# Patient Record
Sex: Female | Born: 1995 | Race: Black or African American | Marital: Single | Smoking: Never smoker
Health system: Southern US, Community
[De-identification: ages and names within clinical notes are randomized; demographics above are authoritative.]

## PROBLEM LIST (undated history)

## (undated) DIAGNOSIS — O039 Complete or unspecified spontaneous abortion without complication: Secondary | ICD-10-CM

---

## 2016-03-31 ENCOUNTER — Encounter: Payer: Self-pay | Admitting: Emergency Medicine

## 2016-03-31 ENCOUNTER — Emergency Department
Admission: EM | Admit: 2016-03-31 | Discharge: 2016-03-31 | Disposition: A | Discharge status: Home / Self Care | Payer: Managed Care, Other (non HMO) | Attending: Emergency Medicine | Admitting: Emergency Medicine

## 2016-03-31 ENCOUNTER — Emergency Department: Payer: Managed Care, Other (non HMO)

## 2016-03-31 DIAGNOSIS — N200 Calculus of kidney: Secondary | ICD-10-CM

## 2016-03-31 DIAGNOSIS — R1031 Right lower quadrant pain: Secondary | ICD-10-CM | POA: Diagnosis present

## 2016-03-31 LAB — COMPREHENSIVE METABOLIC PANEL
ALK PHOS: 76 U/L (ref 38–126)
ALT: 12 U/L — AB (ref 14–54)
AST: 18 U/L (ref 15–41)
Albumin: 3.9 g/dL (ref 3.5–5.0)
Anion gap: 9 (ref 5–15)
BILIRUBIN TOTAL: 1.2 mg/dL (ref 0.3–1.2)
BUN: 8 mg/dL (ref 6–20)
CALCIUM: 8.7 mg/dL — AB (ref 8.9–10.3)
CO2: 24 mmol/L (ref 22–32)
CREATININE: 0.7 mg/dL (ref 0.44–1.00)
Chloride: 106 mmol/L (ref 101–111)
GFR calc Af Amer: >60 mL/min (ref >60)
Glucose, Bld: 132 mg/dL — ABNORMAL HIGH (ref 65–99)
Potassium: 3.1 mmol/L — ABNORMAL LOW (ref 3.5–5.1)
Sodium: 139 mmol/L (ref 135–145)
TOTAL PROTEIN: 7.4 g/dL (ref 6.5–8.1)

## 2016-03-31 LAB — CBC
HCT: 35.4 % (ref 35.0–47.0)
Hemoglobin: 12.2 g/dL (ref 12.0–16.0)
MCH: 31.8 pg (ref 26.0–34.0)
MCHC: 34.4 g/dL (ref 32.0–36.0)
MCV: 92.4 fL (ref 80.0–100.0)
PLATELETS: 178 10*3/uL (ref 150–440)
RBC: 3.83 MIL/uL (ref 3.80–5.20)
RDW: 13.5 % (ref 11.5–14.5)
WBC: 8.9 10*3/uL (ref 3.6–11.0)

## 2016-03-31 LAB — URINALYSIS COMPLETE WITH MICROSCOPIC (ARMC ONLY)
BILIRUBIN URINE: NEGATIVE
Bacteria, UA: NONE SEEN
GLUCOSE, UA: NEGATIVE mg/dL
HGB URINE DIPSTICK: NEGATIVE
Ketones, ur: NEGATIVE mg/dL
NITRITE: NEGATIVE
Protein, ur: 30 mg/dL — AB
SPECIFIC GRAVITY, URINE: 1.024 (ref 1.005–1.030)
pH: 5 (ref 5.0–8.0)

## 2016-03-31 LAB — LIPASE, BLOOD: Lipase: 17 U/L (ref 11–51)

## 2016-03-31 LAB — POCT PREGNANCY, URINE: Preg Test, Ur: NEGATIVE

## 2016-03-31 MED ORDER — SODIUM CHLORIDE 0.9 % IV BOLUS (SEPSIS)
1000.0000 mL | Freq: Once | INTRAVENOUS | Status: AC
  Administered 2016-03-31: 1000 mL via INTRAVENOUS

## 2016-03-31 MED ORDER — KETOROLAC TROMETHAMINE 30 MG/ML IJ SOLN
30.0000 mg | Freq: Once | INTRAMUSCULAR | Status: AC
  Administered 2016-03-31: 30 mg via INTRAVENOUS

## 2016-03-31 MED ORDER — ONDANSETRON HCL 4 MG/2ML IJ SOLN
4.0000 mg | Freq: Once | INTRAMUSCULAR | Status: AC
  Administered 2016-03-31: 4 mg via INTRAVENOUS
  Filled 2016-03-31: qty 2

## 2016-03-31 MED ORDER — KETOROLAC TROMETHAMINE 30 MG/ML IJ SOLN
INTRAMUSCULAR | Status: AC
  Filled 2016-03-31: qty 1

## 2016-03-31 MED ORDER — ONDANSETRON HCL 4 MG PO TABS: 4.0000 mg | ORAL_TABLET | Freq: Every day | ORAL | 1 refills | Status: AC | PRN

## 2016-03-31 MED ORDER — MORPHINE SULFATE (PF) 4 MG/ML IV SOLN
4.0000 mg | Freq: Once | INTRAVENOUS | Status: AC
  Administered 2016-03-31: 4 mg via INTRAVENOUS
  Filled 2016-03-31: qty 1

## 2016-03-31 NOTE — ED Provider Notes (Signed)
[facility] Emergency Department Provider Note  ____________________________________________  Time seen: Approximately 8:25 AM  I have reviewed the triage vital signs and the nursing notes.   HISTORY  Chief Complaint Abdominal Pain   HPI Vanessa Franklin is a 20 y.o. female no significant past medical history who presents for evaluation of right lower quadrant abdominal pain and right flank pain. Patient reports that the pain came on suddenly this morning. She reports that the pain is severe, cramping, worse in the right flank, radiating to her right groin, associated with multiple episodes of nonbloody nonbilious emesis. She denies dysuria or hematuria, fever, prior history of kidney stones. She does report she was diagnosed with chlamydia 4 days ago and he was given IM ceftriaxone and discharged home on by mouth antibiotics. She reports that she didn't take the by mouth antibiotics and to yesterday. She didn't report no pain until this morning. She denies chest pain or shortness of breath. She denies diarrhea or constipation.  History reviewed. No pertinent past medical history.  There are no active problems to display for this patient.   No past surgical history on file.    Allergies Review of patient's allergies indicates no known allergies.  No family history on file.  Social History Social History  Substance Use Topics  . Smoking status: Not on file  . Smokeless tobacco: Not on file  . Alcohol use Not on file    Review of Systems  Constitutional: Negative for fever. Eyes: Negative for visual changes. ENT: Negative for sore throat. Cardiovascular: Negative for chest pain. Respiratory: Negative for shortness of breath. Gastrointestinal: + RLQ abdominal pain, and vomiting. No diarrhea. Genitourinary: Negative for dysuria. + R flank pain Musculoskeletal: Negative for back pain. Skin: Negative for rash. Neurological: Negative for  headaches, weakness or numbness.  ____________________________________________   PHYSICAL EXAM:  VITAL SIGNS: ED Triage Vitals [03/31/16 0802]  Enc Vitals Group     BP 121/62     Pulse Rate 88     Resp (!) 22     Temp 97.9 F (36.6 C)     Temp Source Oral     SpO2 100 %     Weight 120 lb (54.4 kg)     Height  (1.626 m)     Head Circumference      Peak Flow      Pain Score 10     Pain Loc      Pain Edu?      Excl. in GC?     Constitutional: Alert and oriented, in significant distress due to pain.  HEENT:      Head: Normocephalic and atraumatic.         Eyes: Conjunctivae are normal. Sclera is non-icteric. EOMI. PERRL      Mouth/Throat: Mucous membranes are moist.       Neck: Supple with no signs of meningismus. Cardiovascular: Regular rate and rhythm. No murmurs, gallops, or rubs. 2+ symmetrical distal pulses are present in all extremities. No JVD. Respiratory: Normal respiratory effort. Lungs are clear to auscultation bilaterally. No wheezes, crackles, or rhonchi.  Gastrointestinal: Soft, ttp over the RLQ, and non distended with positive bowel sounds. No rebound or guarding. Genitourinary: R CVA tenderness. Musculoskeletal: Nontender with normal range of motion in all extremities. No edema, cyanosis, or erythema of extremities. Neurologic: Normal speech and language. Face is symmetric. Moving all extremities. No gross focal neurologic deficits are appreciated. Skin: Skin is warm, dry and intact. No  rash noted. Psychiatric: Mood and affect are normal. Speech and behavior are normal.  ____________________________________________   LABS (all labs ordered are listed, but only abnormal results are displayed)  Labs Reviewed  COMPREHENSIVE METABOLIC PANEL - Abnormal; Notable for the following:       Result Value   Potassium 3.1 (*)    Glucose, Bld 132 (*)    Calcium 8.7 (*)    ALT 12 (*)    All other components within normal limits  URINALYSIS COMPLETEWITH  MICROSCOPIC ([facility] ONLY) - Abnormal; Notable for the following:    Color, Urine AMBER (*)    APPearance TURBID (*)    Protein, ur 30 (*)    Leukocytes, UA 1+ (*)    Squamous Epithelial / LPF 6-30 (*)    All other components within normal limits  URINE CULTURE  LIPASE, BLOOD  CBC  POC URINE PREG, ED  POCT PREGNANCY, URINE   ____________________________________________  EKG  none ____________________________________________  RADIOLOGY  CT: 88mm stone on the R with hydronephrosis  ____________________________________________   PROCEDURES  Procedure(s) performed: None Critical Care performed:  None ____________________________________________   INITIAL IMPRESSION / ASSESSMENT AND PLAN / ED COURSE  20 y.o. female no significant past medical history who presents for evaluation of sudden onset ight lower quadrant abdominal pain and right flank pain since this am. Patient in moderate distress due to pain, vital signs are within normal limits, as she has right flank tenderness and right lower quadrant tenderness with no rebound or guarding. Differential diagnoses including kidney stone, ovarian torsion, appendicitis, PID. We'll give her IV fluids, IV morphine, IV Zofran, check labs including urinalysis, CBC, CMP, U preg.   Clinical Course  Comment By Time  CT showing a 2 mm stone associated with right-sided hydronephrosis in the pelvis. Patient is no longer having pain. We'll by mouth challenge. UA is pending to rule out superimposed infection. Nita Sickle, MD 07/25 1038  Patient remains pain-free, tolerating by mouth. UA showing leukocytes but no bacteria which is most likely caused by inflammation of the ureter. We'll hold off treatment at this time as patient has no nitrites and no bacteria. Urine culture is pending. We'll discharge home with ibuprofen and Zofran  as stone most likely passed based on scan. Nita Sickle, MD 07/25 1131    Pertinent labs & imaging results  that were available during my care of the patient were reviewed by me and considered in my medical decision making (see chart for details).    ____________________________________________   FINAL CLINICAL IMPRESSION(S) / ED DIAGNOSES  Final diagnoses:  Kidney stone      NEW MEDICATIONS STARTED DURING THIS VISIT:  New Prescriptions   ONDANSETRON (ZOFRAN) 4 MG TABLET    Take 1 tablet (4 mg total) by mouth daily as needed for nausea or vomiting.     Note:  This document was prepared using Dragon voice recognition software and may include unintentional dictation errors.    Nita Sickle, MD 03/31/16 1134

## 2016-03-31 NOTE — Discharge Instructions (Signed)
You have been seen in the Emergency Department (ED)  Today and was diagnosed with kidney stones. While the stone is traveling through the ureter, which is the tube that carries urine from the kidney to the bladder, you will probably feel pain. The pain may be mild or very severe. You may also have some blood in your urine. As soon as the stone reaches the bladder, any intense pain should go away. If a stone is too large to pass on its own, you may need a medical procedure to help you pass the stone.  ° °As we have discussed, please drink plenty of fluids and use a urinary strainer to attempt to capture the stone.  Please make a follow up appointment with Urology in the next week by calling the number below and bring the stone with you. ° °Take ibuprofen 600mg every 6 hours for the pain. Check with your doctor if you have a history of gastritis, stomach ulcers, renal failure or impaired kidney function as you may not be able to take ibuprofen/ motrin. Your doctor can give you a different prescription for pain control. ° °Follow-up with your doctor or return to the ER in 12-24 hours if your pain is not well controlled, if you develop pain or burning with urination, or if you develop a fever. Otherwise follow up in 3-5 days with your doctor. ° °When should you call for help?  °Call your doctor now or seek immediate medical care if:  °You cannot keep down fluids.  °Your pain gets worse.  °You have a fever or chills.  °You have new or worse pain in your back just below your rib cage (the flank area).  °You have new or more blood in your urine. °You have pain or burning with urination °You are unable to urinate °You have abdominal pain ° °Watch closely for changes in your health, and be sure to contact your doctor if:  °You do not get better as expected ° °How can you care for yourself at home?  °Drink plenty of fluids, enough so that your urine is light yellow or clear like water. If you have kidney, heart, or liver  disease and have to limit fluids, talk with your doctor before you increase the amount of fluids you drink.  °Take pain medicines exactly as directed. Call your doctor if you think you are having a problem with your medicine.  °If the doctor gave you a prescription medicine for pain, take it as prescribed.  °If you are not taking a prescription pain medicine, ask your doctor if you can take an over-the-counter medicine. Read and follow all instructions on the label. °Your doctor may ask you to strain your urine so that you can collect your kidney stone when it passes. You can use a kitchen strainer or a tea strainer to catch the stone. Store it in a plastic bag until you see your doctor again. ° °Preventing future kidney stones  °Some changes in your diet may help prevent kidney stones. Depending on the cause of your stones, your doctor may recommend that you:  °Drink plenty of fluids, enough so that your urine is light yellow or clear like water. If you have kidney, heart, or liver disease and have to limit fluids, talk with your doctor before you increase the amount of fluids you drink.  °Limit coffee, tea, and alcohol. Also avoid grapefruit juice.  °Do not take more than the recommended daily dose of vitamins C and D.  °  Avoid antacids such as Gaviscon, Maalox, Mylanta, or Tums.  Limit the amount of salt (sodium) in your diet.  Eat a balanced diet that is not too high in protein.  Limit foods that are high in a substance called oxalate, which can cause kidney stones. These foods include dark green vegetables, rhubarb, chocolate, wheat bran, nuts, cranberries, and beans.

## 2016-03-31 NOTE — ED Notes (Signed)
Pt verbalized desire to be discharged. MD made aware.

## 2016-03-31 NOTE — ED Triage Notes (Signed)
Pt reports lower abdominal pain and right flank pain since this morning. Pt has been vomiting this AM.

## 2016-03-31 NOTE — ED Notes (Signed)
Pt given po trial per MD.  [place] and Sprite given

## 2016-03-31 NOTE — ED Notes (Signed)
POC preg NEG 

## 2016-03-31 NOTE — ED Notes (Signed)
Pt complains of new onset abdominal pain 10/10 lower back and pelvis.  Pt actively vomiting at this time.

## 2016-04-01 LAB — URINE CULTURE

## 2016-04-27 DIAGNOSIS — O039 Complete or unspecified spontaneous abortion without complication: Secondary | ICD-10-CM

## 2016-04-27 HISTORY — DX: Complete or unspecified spontaneous abortion without complication: O03.9

## 2016-05-09 ENCOUNTER — Emergency Department
Admission: EM | Admit: 2016-05-09 | Discharge: 2016-05-09 | Disposition: A | Discharge status: Home / Self Care | Payer: Managed Care, Other (non HMO) | Attending: Emergency Medicine | Admitting: Emergency Medicine

## 2016-05-09 ENCOUNTER — Emergency Department: Payer: Managed Care, Other (non HMO)

## 2016-05-09 ENCOUNTER — Encounter: Payer: Self-pay | Admitting: Emergency Medicine

## 2016-05-09 DIAGNOSIS — R51 Headache: Secondary | ICD-10-CM | POA: Diagnosis not present

## 2016-05-09 DIAGNOSIS — G8929 Other chronic pain: Secondary | ICD-10-CM | POA: Insufficient documentation

## 2016-05-09 LAB — CBC WITH DIFFERENTIAL/PLATELET
Basophils Absolute: 0 10*3/uL (ref 0–0.1)
Basophils Relative: 1 %
EOS PCT: 1 %
Eosinophils Absolute: 0 10*3/uL (ref 0–0.7)
HCT: 33.6 % — ABNORMAL LOW (ref 35.0–47.0)
Hemoglobin: 11.5 g/dL — ABNORMAL LOW (ref 12.0–16.0)
LYMPHS ABS: 1.9 10*3/uL (ref 1.0–3.6)
LYMPHS PCT: 35 %
MCH: 32.1 pg (ref 26.0–34.0)
MCHC: 34.4 g/dL (ref 32.0–36.0)
MCV: 93.4 fL (ref 80.0–100.0)
MONO ABS: 0.4 10*3/uL (ref 0.2–0.9)
Monocytes Relative: 7 %
Neutro Abs: 3.1 10*3/uL (ref 1.4–6.5)
Neutrophils Relative %: 56 %
PLATELETS: 179 10*3/uL (ref 150–440)
RBC: 3.6 MIL/uL — ABNORMAL LOW (ref 3.80–5.20)
RDW: 13.4 % (ref 11.5–14.5)
WBC: 5.5 10*3/uL (ref 3.6–11.0)

## 2016-05-09 LAB — BASIC METABOLIC PANEL
Anion gap: 3 — ABNORMAL LOW (ref 5–15)
BUN: 5 mg/dL — AB (ref 6–20)
CHLORIDE: 107 mmol/L (ref 101–111)
CO2: 28 mmol/L (ref 22–32)
Calcium: 9.2 mg/dL (ref 8.9–10.3)
Creatinine, Ser: 0.55 mg/dL (ref 0.44–1.00)
GFR calc Af Amer: >60 mL/min (ref >60)
GFR calc non Af Amer: >60 mL/min (ref >60)
GLUCOSE: 86 mg/dL (ref 65–99)
POTASSIUM: 3.2 mmol/L — AB (ref 3.5–5.1)
Sodium: 138 mmol/L (ref 135–145)

## 2016-05-09 MED ORDER — KETOROLAC TROMETHAMINE 30 MG/ML IJ SOLN
30.0000 mg | Freq: Once | INTRAMUSCULAR | Status: AC
  Administered 2016-05-09: 30 mg via INTRAVENOUS
  Filled 2016-05-09: qty 1

## 2016-05-09 MED ORDER — KETOROLAC TROMETHAMINE 10 MG PO TABS: 10.0000 mg | ORAL_TABLET | Freq: Three times a day (TID) | ORAL | 0 refills | Status: DC

## 2016-05-09 MED ORDER — SODIUM CHLORIDE 0.9 % IV BOLUS (SEPSIS)
500.0000 mL | Freq: Once | INTRAVENOUS | Status: AC
  Administered 2016-05-09: 500 mL via INTRAVENOUS

## 2016-05-09 MED ORDER — METOCLOPRAMIDE HCL 5 MG PO TABS: 5.0000 mg | ORAL_TABLET | Freq: Three times a day (TID) | ORAL | 0 refills | Status: DC | PRN

## 2016-05-09 MED ORDER — METOCLOPRAMIDE HCL 5 MG/ML IJ SOLN
10.0000 mg | Freq: Once | INTRAMUSCULAR | Status: AC
  Administered 2016-05-09: 10 mg via INTRAVENOUS
  Filled 2016-05-09: qty 2

## 2016-05-09 NOTE — Discharge Instructions (Signed)
Take the prescription pain medicine as needed for headaches. Take the nausea medicine as needed. Follow-up with [facility] for interim headache pain relief.

## 2016-05-09 NOTE — ED Provider Notes (Signed)
[facility] Emergency Department Provider Note ____________________________________________  Time seen: 1739  I have reviewed the triage vital signs and the nursing notes.  HISTORY  Chief Complaint  Headache  HPI Vanessa Franklin is a 20 y.o. female presents to the ED for evaluation of her left frontal and sexual headache that has been worsening over the last week and a half. The patient describes a chronic, [place] the headache for the last 10 days. Prior to that she reported headache frequency on the average of 2-3 headaches per week since March. She is been previously evaluated by [facility] back in March and was prescribed a prescription medication which she has since completed. She reported only limited benefit with that prescription medication. 2 weeks following that visit she was evaluated at [facility] urgent [facility] after sudden onset of a sharp left-sided headache while she was driving. At the time she noted sudden lites and sensitivity, nausea and vomiting. The urgent care treated her with a IM injection and a by mouth. No further testing or imaging was ordered at that time. Her headache did not resolve and they suggested that she follow-up with the ED emergently. The patient describes she did not follow with the ED as directed. Since that time she has noted increasedfrequency of her headache now noting a headache that has been unrelenting for the last week and a half. She has taken Advil, Aleve, ibuprofen, and Tylenol with limited benefit. She reports ongoing nausea without vomiting. She describes the headache as throbbing in nature and notes associated photophobia, She denies any prodromal or is associated with a headache. She is aware when the headaches began noting that the headaches ramp up slowly but are severe in nature. Her current headache she rates it a 9/10 in triage. She reports that her primary provider at [facility], has her scheduled to see a neurologist, but  that appointment is not until February at this time. She has never been on any headache prophylaxis and has not been evaluated with imaging.  History reviewed. No pertinent past medical history.  There are no active problems to display for this patient.  History reviewed. No pertinent surgical history.  Prior to Admission medications   Medication Sig Start Date End Date Taking? Authorizing Provider  ketorolac (TORADOL) 10 MG tablet Take 1 tablet (10 mg total) by mouth every 8 (eight) hours. 05/09/16   Chemika Nightengale V Bacon [place] Chelf, PA-C  metoCLOPramide (REGLAN) 5 MG tablet Take 1 tablet (5 mg total) by mouth every 8 (eight) hours as needed for nausea or vomiting. 05/09/16   Shaelyn Decarli V Bacon Seleena Reimers, PA-C   Allergies Review of patient's allergies indicates no known allergies.  No family history on file.  Social History Social History  Substance Use Topics  . Smoking status: Never Smoker  . Smokeless tobacco: Not on file  . Alcohol use No   Review of Systems  Constitutional: Negative for fever. Eyes: Negative for visual changes.Reports light sensitivity. ENT: Negative for sore throat. Cardiovascular: Negative for chest pain. Respiratory: Negative for shortness of breath. Gastrointestinal: Negative for abdominal pain, vomiting and diarrhea. Musculoskeletal: Negative for back pain. Neurological: Negative for focal weakness or numbness. Reports headache as above. ____________________________________________  PHYSICAL EXAM:  VITAL SIGNS: ED Triage Vitals  Enc Vitals Group     BP 05/09/16 1613 123/63     Pulse Rate 05/09/16 1613 92     Resp 05/09/16 1613 20     Temp 05/09/16 1613 98.4 F (36.9 C)  Temp Source 05/09/16 1613 Oral     SpO2 05/09/16 1613 100 %     Weight 05/09/16 1614 120 lb (54.4 kg)     Height 05/09/16 1614 5\' 4"  (1.626 m)     Head Circumference --      Peak Flow --      Pain Score 05/09/16 1614 10     Pain Loc --      Pain Edu? --      Excl. in GC? --      Constitutional: Alert and oriented. Well appearing and in no distress. Head: Normocephalic and atraumatic.      Eyes: Conjunctivae are normal. PERRL. Normal extraocular movements      Ears: Canals clear. TMs intact bilaterally.   Nose: No congestion/rhinorrhea.   Mouth/Throat: Mucous membranes are moist.   Neck: Supple. No thyromegaly. Cardiovascular: Normal rate, regular rhythm.  Respiratory: Normal respiratory effort. No wheezes/rales/rhonchi. Gastrointestinal: Soft and nontender. No distention. Musculoskeletal: Nontender with normal range of motion in all extremities.  Neurologic: Cranial nerves II through XII grossly intact. Normal speech and language. No gross focal neurologic deficits are appreciated. Skin:  Skin is warm, dry and intact. No rash noted. Psychiatric: Mood and affect are normal. Patient exhibits appropriate insight and judgment. ____________________________________________    LABS (pertinent positives/negatives) Labs Reviewed  BASIC METABOLIC PANEL - Abnormal; Notable for the following:       Result Value   Potassium 3.2 (*)    BUN 5 (*)    Anion gap 3 (*)    All other components within normal limits  CBC WITH DIFFERENTIAL/PLATELET - Abnormal; Notable for the following:    RBC 3.60 (*)    Hemoglobin 11.5 (*)    HCT 33.6 (*)    All other components within normal limits  URINALYSIS COMPLETEWITH MICROSCOPIC ([facility] ONLY)  ____________________________________________   RADIOLOGY  Non-contrast Head CT  IMPRESSION: Negative noncontrast head CT. ____________________________________________  PROCEDURES  Toradol 30 mg IVP Reglan 10 mg IVP NS 500 ml bolus ____________________________________________  INITIAL IMPRESSION / ASSESSMENT AND PLAN / ED COURSE  Patient with reassuring labs and a normal exam without indication of acute intracranial process. A CT scan was also negative for any acute organic abnormality. Patient reports near resolution of  her headache, citing her headache pain reduced to 2/10 at time of this disposition. She will follow up with neurology as previously scheduled via [facility]. She will be discharged with a prescription for ketorolac and Reglan. She is advised to dose OTC Benadryl in addition for headache abortive. See [facility] for interim headache management.   Clinical Course   ____________________________________________  FINAL CLINICAL IMPRESSION(S) / ED DIAGNOSES  Final diagnoses:  Chronic nonintractable headache, unspecified headache type      Lissa Hoard, PA-C 05/09/16 2114    Emily Filbert, MD 05/09/16 2255

## 2016-05-09 NOTE — ED Triage Notes (Signed)
States migraine x 1 week, taking aleve and advil without relief

## 2016-05-11 ENCOUNTER — Ambulatory Visit
Admission: EM | Admit: 2016-05-11 | Discharge: 2016-05-11 | Discharge status: Absent without leave | Payer: Managed Care, Other (non HMO)

## 2016-11-05 ENCOUNTER — Ambulatory Visit
Admission: EM | Admit: 2016-11-05 | Discharge: 2016-11-05 | Disposition: A | Discharge status: Home / Self Care | Payer: Managed Care, Other (non HMO)

## 2016-11-05 DIAGNOSIS — N898 Other specified noninflammatory disorders of vagina: Secondary | ICD-10-CM

## 2016-11-05 DIAGNOSIS — Z8759 Personal history of other complications of pregnancy, childbirth and the puerperium: Secondary | ICD-10-CM

## 2016-11-05 DIAGNOSIS — R102 Pelvic and perineal pain: Secondary | ICD-10-CM | POA: Diagnosis not present

## 2016-11-05 HISTORY — DX: Complete or unspecified spontaneous abortion without complication: O03.9

## 2016-11-05 NOTE — ED Provider Notes (Signed)
CSN: [id number]     Arrival date & time 11/05/16  1447 History   First MD Initiated Contact with Patient 11/05/16 1533     Chief Complaint  Patient presents with  . Abdominal Pain    The history is provided by the patient.  Abdominal Pain  Pain location: suprapubic. Pain quality: cramping   Pain radiates to:  Does not radiate Onset quality:  Sudden Duration:  3 weeks Timing:  Intermittent Progression since onset: cramping started after abortion but intensity is less now than it was initially. Context: not recent sexual activity   Relieved by: some pain improvement with Aleve. Associated symptoms: vaginal bleeding (Vaginal bleeding stopped last week) and vaginal discharge (brown vaginal discharge started last week after bleeding stopped, bad odor per patient)   Associated symptoms: no constipation, no diarrhea, no dysuria, no fever, no hematuria and no nausea    Pt with medical abortion on 10/12/16 at [place] clinic in [place]. She reports she took one pill in the clinic then another four pills the next day. She does not remember what the medication was. She has not been able to get back to the clinic for follow up. Pt denies sexual activity since the abortion.  Pt had a medical abortion 04/27/16 and then presented to UC, PCP, and then [facility] with symptoms similar to today's presentation except then she c/o vaginal bleeding instead of discharge. An US at that time found retrained product of pregnancy that required and D&C.  No past medical history on file. No past surgical history on file. No family history on file. Social History  Substance Use Topics  . Smoking status: Never Smoker  . Smokeless tobacco: Never Used  . Alcohol use No   OB History    No data available     Review of Systems  Constitutional: Negative for fever.  Gastrointestinal: Positive for abdominal pain. Negative for constipation, diarrhea and nausea.  Genitourinary: Positive for vaginal bleeding (Vaginal bleeding  stopped last week) and vaginal discharge (brown vaginal discharge started last week after bleeding stopped, bad odor per patient). Negative for dysuria and hematuria.    Allergies  Patient has no known allergies.  Home Medications   Prior to Admission medications   Not on File   Meds Ordered and Administered this Visit  Medications - No data to display  BP 114/74 (BP Location: Left Arm)   Pulse (!) 102   Temp 98.7 F (37.1 C) (Oral)   Resp 18   Ht 5\' 5"  (1.651 m)   Wt 125 lb (56.7 kg)   LMP 10/13/2016   SpO2 100%   BMI 20.80 kg/m  No data found.   Physical Exam  Constitutional: She is oriented to person, place, and time. She appears well-developed and well-nourished.  HENT:  Head: Normocephalic and atraumatic.  Eyes: EOM are normal. Pupils are equal, round, and reactive to light.  Neck: Normal range of motion. Neck supple.  Cardiovascular: Normal rate, regular rhythm and normal heart sounds.   Pulmonary/Chest: Effort normal and breath sounds normal.  Abdominal: Soft. She exhibits no distension. There is no tenderness. There is no rebound and no guarding.  Neurological: She is alert and oriented to person, place, and time.  Skin: Skin is warm and dry.    Urgent Care Course     Procedures (including critical care time)  Labs Review Labs Reviewed - No data to display  Imaging Review No results found.    MDM   1. Pelvic cramping  2. Vaginal discharge   3. Abortion history    Pt status post medical abortion on 2/5 and continues to have brown discharge and cramping off and on. Pt's presentation is very similar to her presentation on 05/12/16 with vaginal bleeding after a medical abortion on 8/21. At that time, she was found to have retained product and required a D&C. Given the similarity in presentation, a complete evaluation will require an US examination and possible intervention. [place] is not available at this facility at this time.   Discussion with patient  with above concerns for similar presentation and possibility of retained product and infection and the need for US exam for complete evaluation. Pt was given the option of [facility] in [place] and [facility] in [place] as [facility] would not have a GYN physician available for [facility] if in needed. Pt understood and was in agreement with plan.   [facility] ER Triage Nurse Vikki Ports was contacted and given report information on the patient.    Candis Schatz, PA-C 11/05/16 [phone]

## 2016-11-05 NOTE — ED Triage Notes (Addendum)
Pt had an abortion Feb 5, she is having some lower abdominal cramping and discharge with odor. She states the bleeding has stopped though. Approx gestation was 7-8 weeks

## 2016-11-05 NOTE — Discharge Instructions (Signed)
-   Present to emergency room at [facility] in [place] for further evaluation and management at facility with ultrasound and OB/GYN services available. If unable to go to [facility], [facility] in [place] can also provide the care you need.

## 2018-01-14 IMAGING — CT CT HEAD W/O CM
3 series · 15 of 45 positions shown, 18 images · non-contrast
Comparison: None.

CLINICAL DATA: Worsening intractable headache for 2 weeks.

EXAM:
CT HEAD WITHOUT CONTRAST
TECHNIQUE: Contiguous axial images were obtained from the base of the skull
through the vertex without intravenous contrast.

[Series 2: head wo · axial · 0.47mm/px · z∈[+563,+678]mm · 9 of 28 slices shown, 12 images]
[im 3/28  brain]
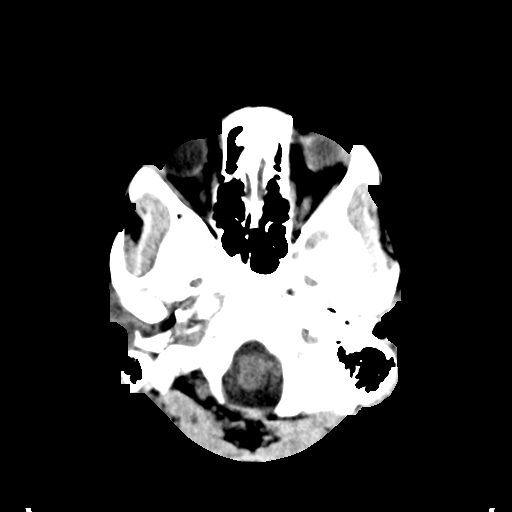
[im 3/28  bone]
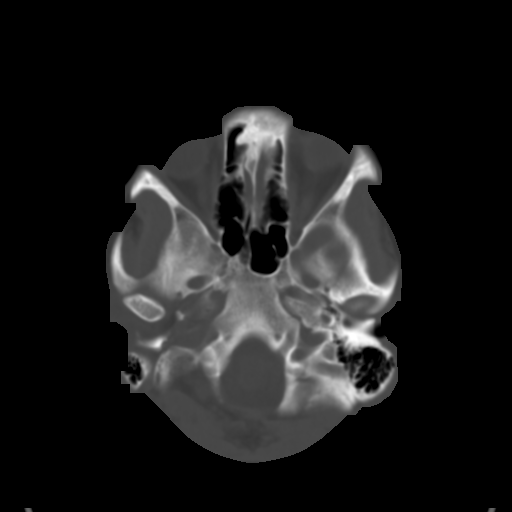
[im 6/28  brain]
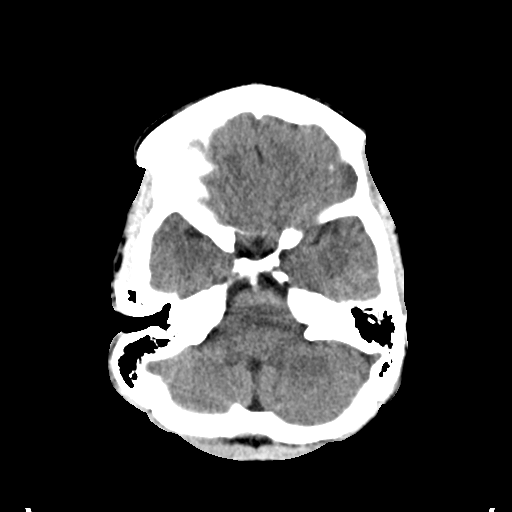
[im 9/28  brain]
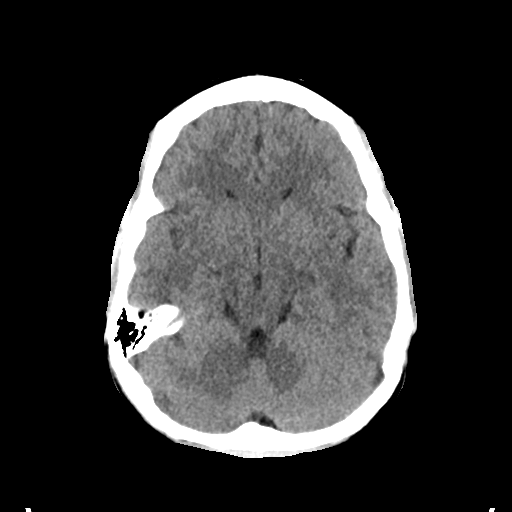
[im 12/28  brain]
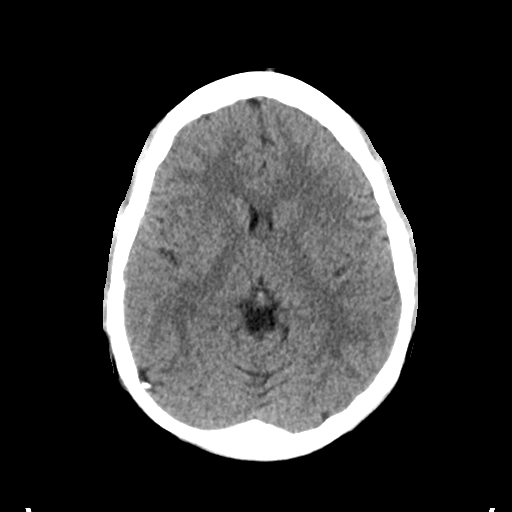
[im 15/28  brain]
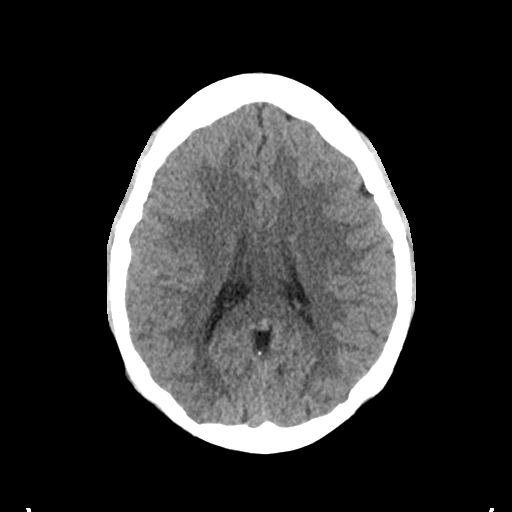
[im 15/28  bone]
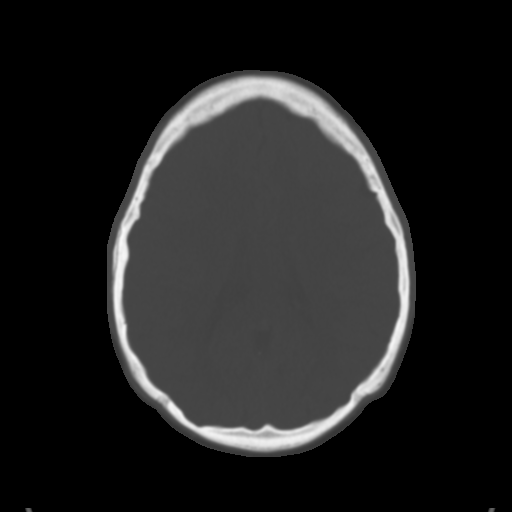
[im 17/28  brain]
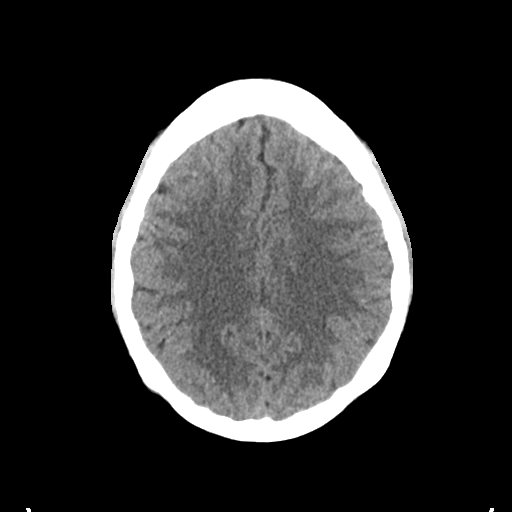
[im 20/28  brain]
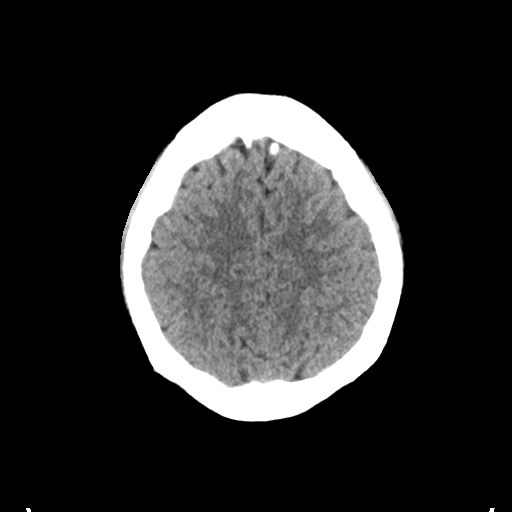
[im 23/28  brain]
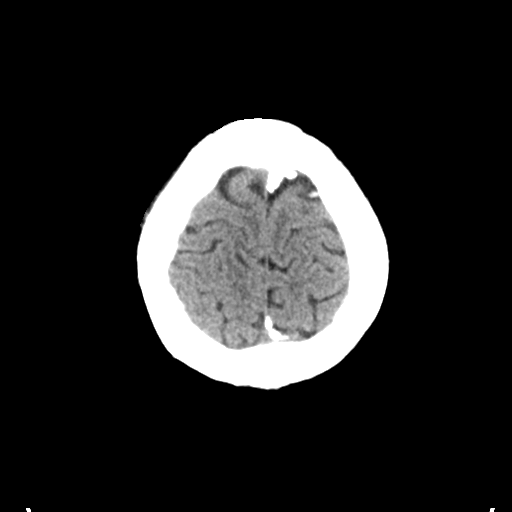
[im 26/28  brain]
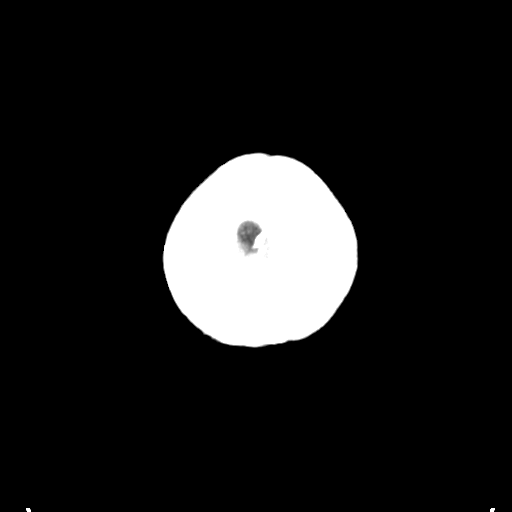
[im 26/28  bone]
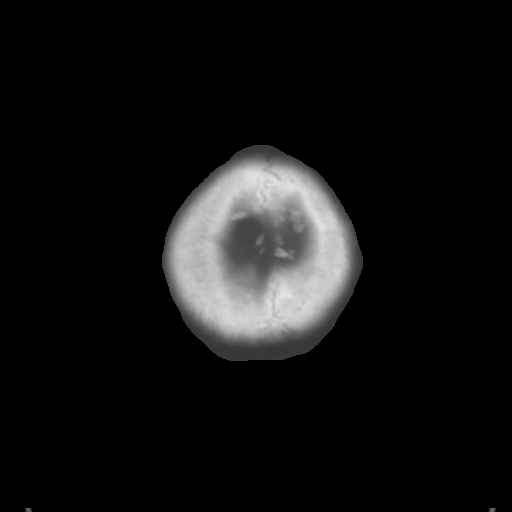

[Series 4: coronal soft tissue · coronal · 0.25mm/px · 3 of 67 slices shown]
[im 23/67  brain]
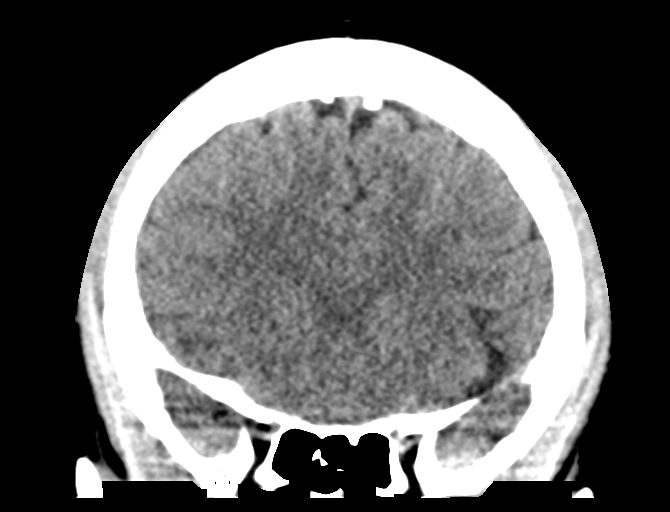
[im 30/67  brain]
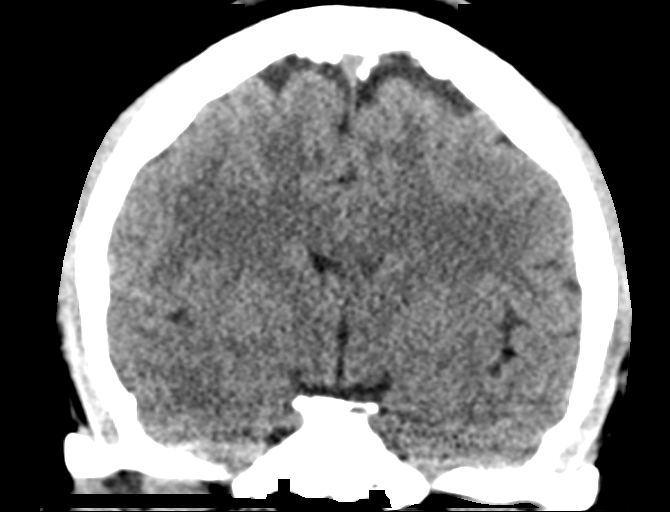
[im 37/67  brain]
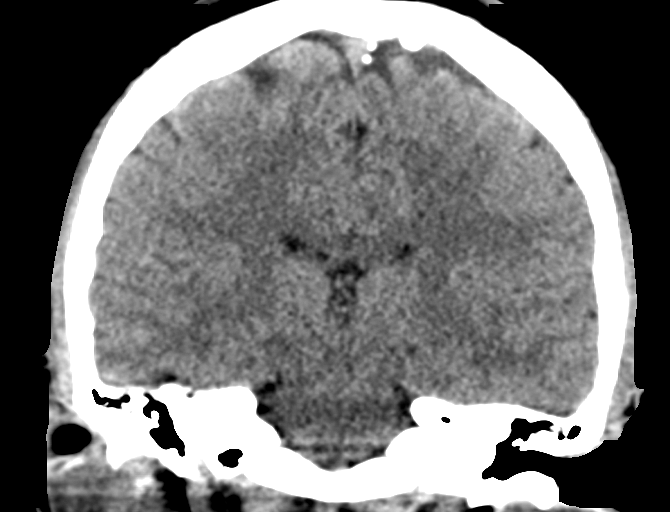

[Series 5: sagittal soft tissue · sagittal · 0.27mm/px · 3 of 52 slices shown]
[im 18/52  brain]
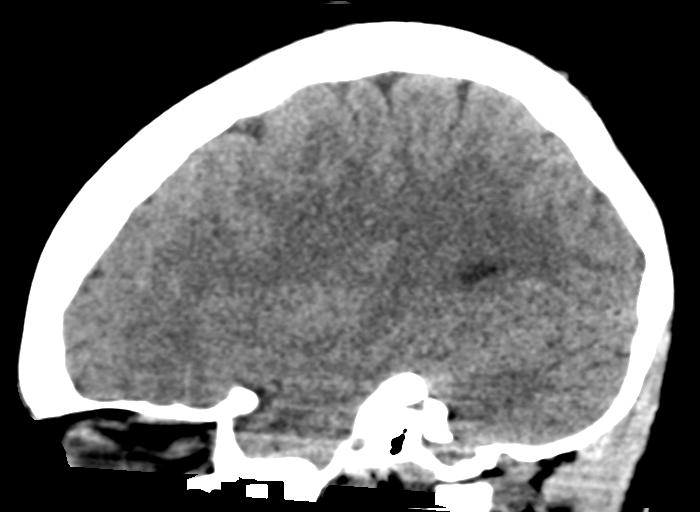
[im 26/52  brain]
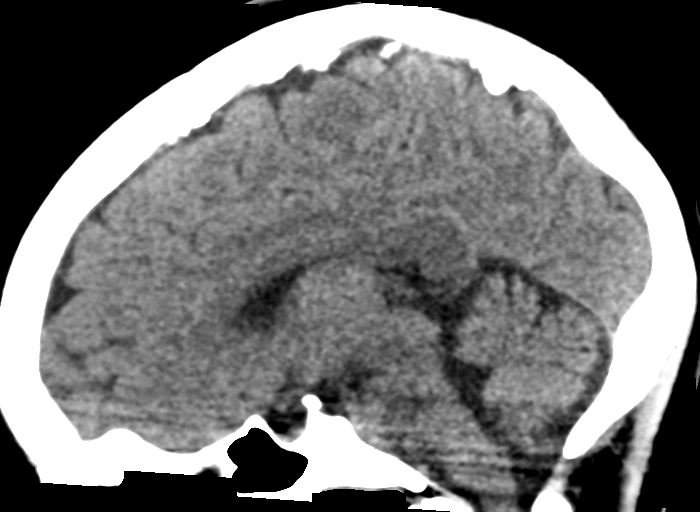
[im 35/52  brain]
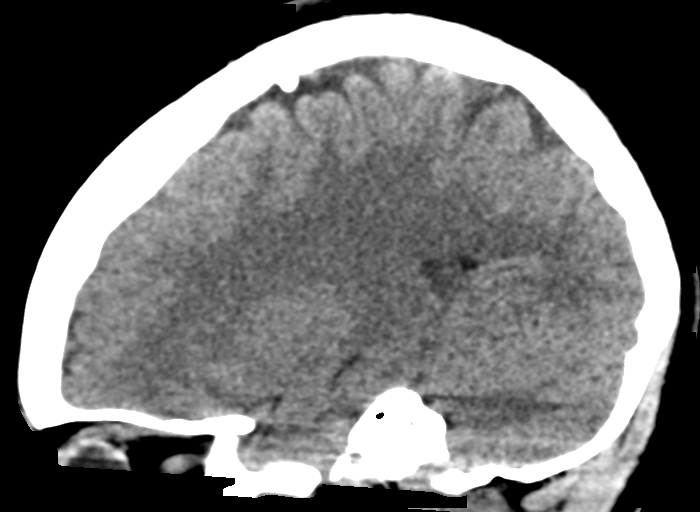

[15 of 45 positions shown; findings below may reference images not displayed]

FINDINGS: Brain: No evidence of acute infarction, hemorrhage, hydrocephalus,
extra-axial collection or mass lesion/mass effect.

Vascular: No hyperdense vessel or unexpected calcification.

Skull: No fracture or other bone lesions identified.

Sinuses/Orbits: No acute finding.

Other: None
IMPRESSION: Negative noncontrast head CT.

## 2018-04-25 ENCOUNTER — Ambulatory Visit
Admission: EM | Admit: 2018-04-25 | Discharge: 2018-04-25 | Disposition: A | Discharge status: Home / Self Care | Payer: Managed Care, Other (non HMO) | Attending: Internal Medicine | Admitting: Internal Medicine

## 2018-04-25 ENCOUNTER — Other Ambulatory Visit: Payer: Self-pay

## 2018-04-25 DIAGNOSIS — J020 Streptococcal pharyngitis: Secondary | ICD-10-CM

## 2018-04-25 LAB — RAPID STREP SCREEN (MED CTR MEBANE ONLY): Streptococcus, Group A Screen (Direct): POSITIVE — AB

## 2018-04-25 MED ORDER — PENICILLIN G BENZATHINE 1200000 UNIT/2ML IM SUSP
1.2000 10*6.[IU] | Freq: Once | INTRAMUSCULAR | Status: AC
  Administered 2018-04-25: 1.2 10*6.[IU] via INTRAMUSCULAR

## 2018-04-25 NOTE — Discharge Instructions (Addendum)
Take over-the-counter Tylenol and ibuprofen as needed.  Rest. Drink plenty of fluids.  ° °Follow up with your primary care physician this week as needed. Return to Urgent care for new or worsening concerns.  ° °

## 2018-04-25 NOTE — ED Triage Notes (Signed)
Patient complains of sore throat, body aches and chills x 2 days.

## 2018-04-25 NOTE — ED Provider Notes (Signed)
[facility] ____________________________________________  Time seen: Approximately 12:32 PM  I have reviewed the triage vital signs and the nursing notes.   HISTORY  Chief Complaint Sore Throat   HPI Vanessa Franklin is a 22 y.o. female presented for evaluation of sore throat for the last 2 days with accompanying chills, body aches and intermittent headache.  Has not been taking any over-the-counter medications for the same complaints, no antipyretics taken today prior to arrival.  States occasional nasal congestion, no cough or increased nasal congestion.  Sore throat currently moderate.  Has overall continued to drink water well but decreased eating as it hurts to swallow.  Denies known sick contacts, does work around elderly.  22-year-old at home not sick.  Denies other aggravating alleviating factors.  Reports otherwise feels well.  Denies current pregnancy.  No recent antibiotic use.   Past Medical History:  Diagnosis Date  . Abortion 04/27/2016    There are no active problems to display for this patient.   History reviewed. No pertinent surgical history.   No current facility-administered medications for this encounter.  No current outpatient medications on file.  Allergies Patient has no known allergies.  Family History  Problem Relation Age of Onset  . Hypertension Mother     Social History Social History   Tobacco Use  . Smoking status: Never Smoker  . Smokeless tobacco: Never Used  Substance Use Topics  . Alcohol use: Yes    Comment: occasionally  . Drug use: Not on file    Review of Systems Constitutional: positive chills.  ENT: positive sore throat. Cardiovascular: Denies chest pain. Respiratory: Denies shortness of breath Musculoskeletal: Negative for back pain. Skin: Negative for rash.   ____________________________________________   PHYSICAL EXAM:  VITAL SIGNS: ED Triage Vitals  Enc Vitals Group     BP 04/25/18 1022  121/75     Pulse Rate 04/25/18 1022 (!) 109     Resp 04/25/18 1022 18     Temp 04/25/18 1022 99 F (37.2 C)     Temp Source 04/25/18 1022 Oral     SpO2 04/25/18 1022 100 %     Weight 04/25/18 1022 130 lb (59 kg)     Height 04/25/18 1022 5\' 4"  (1.626 m)     Head Circumference --      Peak Flow --      Pain Score 04/25/18 1021 6     Pain Loc --      Pain Edu? --      Excl. in GC? --     Constitutional: Alert and oriented. Well appearing and in no acute distress. Eyes: Conjunctivae are normal. Head: Atraumatic. No sinus tenderness to palpation. No swelling. No erythema.  Ears: no erythema, normal TMs bilaterally.   Nose:No nasal congestion   Mouth/Throat: Mucous membranes are moist.moderate pharyngeal erythema.  Mild bilateral tonsillar swelling.  No exudate.  No uvular shift or deviation. Neck: No stridor.  No cervical spine tenderness to palpation. Hematological/Lymphatic/Immunilogical: Anterior bilateral cervical lymphadenopathy. Cardiovascular: Normal rate, regular rhythm. Grossly normal heart sounds.  Good peripheral circulation. Respiratory: Normal respiratory effort.  No retractions. No wheezes, rales or rhonchi. Good air movement.  Musculoskeletal: Ambulatory with steady gait. No cervical, thoracic or lumbar tenderness to palpation. Neurologic:  Normal speech and language. No gait instability. Skin:  Skin appears warm, dry and intact. No rash noted. Psychiatric: Mood and affect are normal. Speech and behavior are normal.  ___________________________________________   LABS (all labs ordered are listed, but  only abnormal results are displayed)  Labs Reviewed  RAPID STREP SCREEN ([facility] ONLY) - Abnormal; Notable for the following components:      Result Value   Streptococcus, Group A Screen (Direct) POSITIVE (*)    All other components within normal limits   ____________________________________________  PROCEDURES Procedures     INITIAL IMPRESSION /  ASSESSMENT AND PLAN / ED COURSE  Pertinent labs & imaging results that were available during my care of the patient were reviewed by me and considered in my medical decision making (see chart for details).  Well-appearing patient.  No acute distress.  Strep positive.  Exam consistent with same.  Discussed treatment options with patient, patient elected for Bicillin.  1,200,000 units Bicillin injection given once in urgent care.  Encourage rest, fluids, over-the-counter Tylenol and ibuprofen, gargles and lozenges.  Work note given for today and tomorrow.Discussed indication, risks and benefits of medications with patient.  Discussed follow up with Primary care physician this week. Discussed follow up and return parameters including no resolution or any worsening concerns. Patient verbalized understanding and agreed to plan.   ____________________________________________   FINAL CLINICAL IMPRESSION(S) / ED DIAGNOSES  Final diagnoses:  Strep throat     ED Discharge Orders    None       Note: This dictation was prepared with Dragon dictation along with smaller phrase technology. Any transcriptional errors that result from this process are unintentional.         Renford DillsMiller, Coralie Stanke, NP 04/25/18 1234

## 2018-07-08 ENCOUNTER — Ambulatory Visit
Admission: EM | Admit: 2018-07-08 | Discharge: 2018-07-08 | Disposition: A | Discharge status: Home / Self Care | Payer: Managed Care, Other (non HMO) | Attending: Family Medicine | Admitting: Family Medicine

## 2018-07-08 ENCOUNTER — Encounter: Payer: Self-pay | Admitting: Emergency Medicine

## 2018-07-08 ENCOUNTER — Other Ambulatory Visit: Payer: Self-pay

## 2018-07-08 DIAGNOSIS — B9689 Other specified bacterial agents as the cause of diseases classified elsewhere: Secondary | ICD-10-CM

## 2018-07-08 DIAGNOSIS — R300 Dysuria: Secondary | ICD-10-CM

## 2018-07-08 DIAGNOSIS — Z113 Encounter for screening for infections with a predominantly sexual mode of transmission: Secondary | ICD-10-CM

## 2018-07-08 DIAGNOSIS — L0291 Cutaneous abscess, unspecified: Secondary | ICD-10-CM

## 2018-07-08 DIAGNOSIS — N760 Acute vaginitis: Secondary | ICD-10-CM

## 2018-07-08 DIAGNOSIS — N9089 Other specified noninflammatory disorders of vulva and perineum: Secondary | ICD-10-CM

## 2018-07-08 LAB — URINALYSIS, COMPLETE (UACMP) WITH MICROSCOPIC
BACTERIA UA: NONE SEEN
BILIRUBIN URINE: NEGATIVE
Glucose, UA: NEGATIVE mg/dL
KETONES UR: NEGATIVE mg/dL
LEUKOCYTES UA: NEGATIVE
Nitrite: NEGATIVE
PROTEIN: NEGATIVE mg/dL
SQUAMOUS EPITHELIAL / LPF: NONE SEEN (ref 0–5)
Specific Gravity, Urine: 1.02 (ref 1.005–1.030)
pH: 7 (ref 5.0–8.0)

## 2018-07-08 LAB — WET PREP, GENITAL
SPERM: NONE SEEN
Trich, Wet Prep: NONE SEEN
Yeast Wet Prep HPF POC: NONE SEEN

## 2018-07-08 LAB — CHLAMYDIA/NGC RT PCR (ARMC ONLY)
CHLAMYDIA TR: NOT DETECTED
N GONORRHOEAE: NOT DETECTED

## 2018-07-08 MED ORDER — SULFAMETHOXAZOLE-TRIMETHOPRIM 800-160 MG PO TABS: 1.0000 | ORAL_TABLET | Freq: Two times a day (BID) | ORAL | 0 refills | Status: AC

## 2018-07-08 MED ORDER — METRONIDAZOLE 500 MG PO TABS: 500.0000 mg | ORAL_TABLET | Freq: Two times a day (BID) | ORAL | 0 refills | Status: AC

## 2018-07-08 NOTE — Discharge Instructions (Addendum)
Take medication as prescribed. Rest. Drink plenty of fluids. Avoid shaving area.   Follow up with your primary care physician this week as needed. Return to Urgent care for new or worsening concerns.

## 2018-07-08 NOTE — ED Triage Notes (Addendum)
Patient in today c/o vaginal discharge, urinary frequency x 3-4 days. Patient also states that she has a boil at the top of her vagina. Patient states that she has had BV before and it feels similar. Patient denies concern for GC/Chlamydia, but states that she would like to be checked anyway. Patient denies fever. Patient has tried drinking cranberry juice without relief.

## 2018-07-08 NOTE — ED Provider Notes (Signed)
[facility] ____________________________________________  Time seen: Approximately 11:45 AM  I have reviewed the triage vital signs and the nursing notes.   HISTORY  Chief Complaint Vaginal Discharge  HPI Vanessa Franklin is a 22 y.o. female presenting for evaluation of some urinary frequency and vaginal discharge present for the last 3 to 4 days.  States vaginal discharge tends to be whitish in color with accompanying odor, similar to previous bacterial vaginosis infection.  Patient reports she recently has had STD serum testing and declines testing for that, however would like to have gonorrhea and Chlamydia testing performed.  States she is not highly concerned for STDs but would just like to have this performed.  Sexually active with same partner, not recently.  Denies concerns of pregnancy currently.  Continues to eat and drink well.  Denies associated abdominal pain, back pain, vomiting, diarrhea, chest pain, shortness of breath, pelvic pain.  States that she does have 2 small bumps to the upper vaginal area, stating they are not tender or draining but would like to have it evaluated.  States that she has had this similarly in the past that goes away over time.  Does report recently shaving prior to noticing.  Denies other aggravating alleviating factors.  Reports otherwise doing well denies other complaints.  Patient's last menstrual period was 06/20/2018 (approximate).Denies pregnancy.  PCP: [facility]    Past Medical History:  Diagnosis Date  . Abortion 04/27/2016    There are no active problems to display for this patient.   History reviewed. No pertinent surgical history.   No current facility-administered medications for this encounter.   Current Outpatient Medications:  .  metroNIDAZOLE (FLAGYL) 500 MG tablet, Take 1 tablet (500 mg total) by mouth 2 (two) times daily., Disp: 14 tablet, Rfl: 0 .  sulfamethoxazole-trimethoprim (BACTRIM  DS,SEPTRA DS) 800-160 MG tablet, Take 1 tablet by mouth 2 (two) times daily for 7 days., Disp: 14 tablet, Rfl: 0  Allergies Patient has no known allergies.  Family History  Problem Relation Age of Onset  . Hypertension Mother   . Other Father        gunshot    Social History Social History   Tobacco Use  . Smoking status: Never Smoker  . Smokeless tobacco: Never Used  Substance Use Topics  . Alcohol use: Yes    Comment: occasionally  . Drug use: Not on file    Review of Systems Constitutional: No fever Cardiovascular: Denies chest pain. Respiratory: Denies shortness of breath. Gastrointestinal: No abdominal pain.  No nausea, no vomiting.  No diarrhea.   Genitourinary: positive for dysuria, as above.  Musculoskeletal: Negative for back pain. Skin: Negative for rash.   ____________________________________________   PHYSICAL EXAM:  VITAL SIGNS: ED Triage Vitals  Enc Vitals Group     BP 07/08/18 1106 109/71     Pulse Rate 07/08/18 1106 70     Resp 07/08/18 1106 16     Temp 07/08/18 1106 98.2 F (36.8 C)     Temp Source 07/08/18 1106 Oral     SpO2 07/08/18 1106 100 %     Weight 07/08/18 1107 130 lb (59 kg)     Height 07/08/18 1107 5\' 5"  (1.651 m)     Head Circumference --      Peak Flow --      Pain Score 07/08/18 1107 0     Pain Loc --      Pain Edu? --  Excl. in GC? --     Constitutional: Alert and oriented. Well appearing and in no acute distress. ENT      Head: Normocephalic and atraumatic. Cardiovascular: Normal rate, regular rhythm. Grossly normal heart sounds.  Good peripheral circulation. Respiratory: Normal respiratory effort without tachypnea nor retractions. Breath sounds are clear and equal bilaterally. No wheezes, rales, rhonchi. Gastrointestinal: Soft and nontender. No CVA tenderness. Musculoskeletal:  No midline cervical, thoracic or lumbar tenderness to palpation. Neurologic:  Normal speech and language. Speech is normal. No gait  instability.  Skin:  Skin is warm, dry.  Patient declined chaperone. External only suprapubic area visualized, noted to have x2 less than 1 cm indurated areas without erythema, no fluctuance, no drainage, nontender.  No other surrounding skin changes. Psychiatric: Mood and affect are normal. Speech and behavior are normal. Patient exhibits appropriate insight and judgment   ___________________________________________   LABS (all labs ordered are listed, but only abnormal results are displayed)  Labs Reviewed  WET PREP, GENITAL - Abnormal; Notable for the following components:      Result Value   Clue Cells Wet Prep HPF POC PRESENT (*)    WBC, Wet Prep HPF POC FEW (*)    All other components within normal limits  URINALYSIS, COMPLETE (UACMP) WITH MICROSCOPIC - Abnormal; Notable for the following components:   Hgb urine dipstick TRACE (*)    All other components within normal limits  CHLAMYDIA/NGC RT PCR ([facility] ONLY)    PROCEDURES Procedures    INITIAL IMPRESSION / ASSESSMENT AND PLAN / ED COURSE  Pertinent labs & imaging results that were available during my care of the patient were reviewed by me and considered in my medical decision making (see chart for details).  Well-appearing patient.  No acute distress.  Presented for evaluation of dysuria and vaginal discharge.  Urinalysis reviewed, overall unremarkable.  Patient elected for self wet prep, wet prep positive for bacterial vaginosis.  Patient also requested gonorrhea and chlamydia swabs completed, declines any further STD testing.  Patient with x2 suprapubic indurated areas, suspect secondary to shaving and counseled regarding this.  Avoid manipulating the skin in this area.  Will treat with oral Flagyl and Bactrim.  Discussed supportive care and monitoring.Discussed indication, risks and benefits of medications with patient.  Discussed follow up with Primary care physician this week. Discussed follow up and return  parameters including no resolution or any worsening concerns. Patient verbalized understanding and agreed to plan.   ____________________________________________   FINAL CLINICAL IMPRESSION(S) / ED DIAGNOSES  Final diagnoses:  Bacterial vaginosis  Cutaneous abscess, unspecified site     ED Discharge Orders         Ordered    sulfamethoxazole-trimethoprim (BACTRIM DS,SEPTRA DS) 800-160 MG tablet  2 times daily     07/08/18 1143    metroNIDAZOLE (FLAGYL) 500 MG tablet  2 times daily     07/08/18 1143           Note: This dictation was prepared with Dragon dictation along with smaller phrase technology. Any transcriptional errors that result from this process are unintentional.         Renford Dills, NP 07/08/18 1215
